# Patient Record
Sex: Female | Born: 1995 | Race: Black or African American | Hispanic: No | Marital: Single | Smoking: Never smoker
Health system: Southern US, Community
[De-identification: ages and names within clinical notes are randomized; demographics above are authoritative.]

## PROBLEM LIST (undated history)

## (undated) DIAGNOSIS — D649 Anemia, unspecified: Secondary | ICD-10-CM

## (undated) DIAGNOSIS — F419 Anxiety disorder, unspecified: Secondary | ICD-10-CM

---
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
## 2014-08-07 HISTORY — PX: DENTAL SURGERY: SHX609

## 2015-02-03 ENCOUNTER — Emergency Department (HOSPITAL_COMMUNITY)
Admission: EM | Admit: 2015-02-03 | Discharge: 2015-02-03 | Disposition: A | Discharge status: Home / Self Care | Payer: Medicaid Other

## 2015-02-03 ENCOUNTER — Emergency Department (HOSPITAL_COMMUNITY)
Admission: EM | Admit: 2015-02-03 | Discharge: 2015-02-03 | Disposition: A | Discharge status: Home / Self Care | Payer: Medicaid Other | Attending: Emergency Medicine | Admitting: Emergency Medicine

## 2015-02-03 ENCOUNTER — Encounter (HOSPITAL_COMMUNITY): Payer: Self-pay | Admitting: Family Medicine

## 2015-02-03 ENCOUNTER — Emergency Department (HOSPITAL_COMMUNITY): Payer: Medicaid Other

## 2015-02-03 DIAGNOSIS — Z3490 Encounter for supervision of normal pregnancy, unspecified, unspecified trimester: Secondary | ICD-10-CM

## 2015-02-03 DIAGNOSIS — O9989 Other specified diseases and conditions complicating pregnancy, childbirth and the puerperium: Secondary | ICD-10-CM | POA: Diagnosis not present

## 2015-02-03 DIAGNOSIS — O210 Mild hyperemesis gravidarum: Secondary | ICD-10-CM | POA: Diagnosis present

## 2015-02-03 DIAGNOSIS — O26899 Other specified pregnancy related conditions, unspecified trimester: Secondary | ICD-10-CM

## 2015-02-03 DIAGNOSIS — K219 Gastro-esophageal reflux disease without esophagitis: Secondary | ICD-10-CM

## 2015-02-03 DIAGNOSIS — K5900 Constipation, unspecified: Secondary | ICD-10-CM | POA: Diagnosis not present

## 2015-02-03 DIAGNOSIS — O99611 Diseases of the digestive system complicating pregnancy, first trimester: Secondary | ICD-10-CM | POA: Insufficient documentation

## 2015-02-03 DIAGNOSIS — O219 Vomiting of pregnancy, unspecified: Secondary | ICD-10-CM

## 2015-02-03 DIAGNOSIS — R067 Sneezing: Secondary | ICD-10-CM | POA: Insufficient documentation

## 2015-02-03 DIAGNOSIS — Z79899 Other long term (current) drug therapy: Secondary | ICD-10-CM | POA: Diagnosis not present

## 2015-02-03 DIAGNOSIS — R109 Unspecified abdominal pain: Secondary | ICD-10-CM

## 2015-02-03 DIAGNOSIS — R05 Cough: Secondary | ICD-10-CM | POA: Insufficient documentation

## 2015-02-03 DIAGNOSIS — Z3A08 8 weeks gestation of pregnancy: Secondary | ICD-10-CM | POA: Diagnosis not present

## 2015-02-03 DIAGNOSIS — R630 Anorexia: Secondary | ICD-10-CM | POA: Insufficient documentation

## 2015-02-03 DIAGNOSIS — R102 Pelvic and perineal pain: Secondary | ICD-10-CM

## 2015-02-03 LAB — CBC WITH DIFFERENTIAL/PLATELET
BASOS ABS: 0 10*3/uL (ref 0.0–0.1)
Basophils Relative: 0 % (ref 0–1)
Eosinophils Absolute: 0 10*3/uL (ref 0.0–0.7)
Eosinophils Relative: 1 % (ref 0–5)
HEMATOCRIT: 36.7 % (ref 36.0–46.0)
Hemoglobin: 12.6 g/dL (ref 12.0–15.0)
LYMPHS PCT: 35 % (ref 12–46)
Lymphs Abs: 2 10*3/uL (ref 0.7–4.0)
MCH: 28.1 pg (ref 26.0–34.0)
MCHC: 34.3 g/dL (ref 30.0–36.0)
MCV: 81.7 fL (ref 78.0–100.0)
MONO ABS: 0.5 10*3/uL (ref 0.1–1.0)
Monocytes Relative: 9 % (ref 3–12)
NEUTROS ABS: 3.2 10*3/uL (ref 1.7–7.7)
NEUTROS PCT: 55 % (ref 43–77)
Platelets: 319 10*3/uL (ref 150–400)
RBC: 4.49 MIL/uL (ref 3.87–5.11)
RDW: 13.2 % (ref 11.5–15.5)
WBC: 5.8 10*3/uL (ref 4.0–10.5)

## 2015-02-03 LAB — COMPREHENSIVE METABOLIC PANEL
ALT: 17 U/L (ref 14–54)
ANION GAP: 11 (ref 5–15)
AST: 22 U/L (ref 15–41)
Albumin: 4.1 g/dL (ref 3.5–5.0)
Alkaline Phosphatase: 22 U/L — ABNORMAL LOW (ref 38–126)
BILIRUBIN TOTAL: 0.7 mg/dL (ref 0.3–1.2)
CALCIUM: 9.4 mg/dL (ref 8.9–10.3)
CHLORIDE: 102 mmol/L (ref 101–111)
CO2: 21 mmol/L — AB (ref 22–32)
CREATININE: 0.59 mg/dL (ref 0.44–1.00)
GLUCOSE: 103 mg/dL — AB (ref 65–99)
Potassium: 3.9 mmol/L (ref 3.5–5.1)
Sodium: 134 mmol/L — ABNORMAL LOW (ref 135–145)
Total Protein: 7.2 g/dL (ref 6.5–8.1)

## 2015-02-03 LAB — I-STAT BETA HCG BLOOD, ED (MC, WL, AP ONLY): I-stat hCG, quantitative: >2000 m[IU]/mL — ABNORMAL HIGH (ref <5)

## 2015-02-03 LAB — HCG, QUANTITATIVE, PREGNANCY: hCG, Beta Chain, Quant, S: 195159 m[IU]/mL — ABNORMAL HIGH (ref <5)

## 2015-02-03 MED ORDER — FAMOTIDINE 20 MG PO TABS
20.0000 mg | ORAL_TABLET | Freq: Once | ORAL | Status: AC
  Administered 2015-02-03: 20 mg via ORAL
  Filled 2015-02-03: qty 1

## 2015-02-03 MED ORDER — FAMOTIDINE 20 MG PO TABS: 20.0000 mg | ORAL_TABLET | Freq: Two times a day (BID) | ORAL | Status: DC

## 2015-02-03 NOTE — Discharge Instructions (Signed)
Morning Sickness Morning sickness is when you feel sick to your stomach (nauseous) during pregnancy. This nauseous feeling may or may not come with vomiting. It often occurs in the morning but can be a problem any time of day. Morning sickness is most common during the first trimester, but it may continue throughout pregnancy. While morning sickness is unpleasant, it is usually harmless unless you develop severe and continual vomiting (hyperemesis gravidarum). This condition requires more intense treatment.  CAUSES  The cause of morning sickness is not completely known but seems to be related to normal hormonal changes that occur in pregnancy. RISK FACTORS You are at greater risk if you:  Experienced nausea or vomiting before your pregnancy.  Had morning sickness during a previous pregnancy.  Are pregnant with more than one baby, such as twins. TREATMENT  Do not use any medicines (prescription, over-the-counter, or herbal) for morning sickness without first talking to your health care provider. Your health care provider may prescribe or recommend:  Vitamin B6 supplements.  Anti-nausea medicines.  The herbal medicine ginger. HOME CARE INSTRUCTIONS   Only take over-the-counter or prescription medicines as directed by your health care provider.  Taking multivitamins before getting pregnant can prevent or decrease the severity of morning sickness in most women.  Eat a piece of dry toast or unsalted crackers before getting out of bed in the morning.  Eat five or six small meals a day.  Eat dry and bland foods (rice, baked potato). Foods high in carbohydrates are often helpful.  Do not drink liquids with your meals. Drink liquids between meals.  Avoid greasy, fatty, and spicy foods.  Get someone to cook for you if the smell of any food causes nausea and vomiting.  If you feel nauseous after taking prenatal vitamins, take the vitamins at night or with a snack.  Snack on protein  foods (nuts, yogurt, cheese) between meals if you are hungry.  Eat unsweetened gelatins for desserts.  Wearing an acupressure wristband (worn for sea sickness) may be helpful.  Acupuncture may be helpful.  Do not smoke.  Get a humidifier to keep the air in your house free of odors.  Get plenty of fresh air. SEEK MEDICAL CARE IF:   Your home remedies are not working, and you need medicine.  You feel dizzy or lightheaded.  You are losing weight. SEEK IMMEDIATE MEDICAL CARE IF:   You have persistent and uncontrolled nausea and vomiting.  You pass out (faint). MAKE SURE YOU:  Understand these instructions.  Will watch your condition.  Will get help right away if you are not doing well or get worse. Document Released: 09/14/2006 Document Revised: 07/29/2013 Document Reviewed: 01/08/2013 [facility] Patient Information 2015 [place], [place]. This information is not intended to replace advice given to you by your health care provider. Make sure you discuss any questions you have with your health care provider.    Abdominal Pain During Pregnancy Abdominal pain is common in pregnancy. Most of the time, it does not cause harm. There are many causes of abdominal pain. Some causes are more serious than others. Some of the causes of abdominal pain in pregnancy are easily diagnosed. Occasionally, the diagnosis takes time to understand. Other times, the cause is not determined. Abdominal pain can be a sign that something is very wrong with the pregnancy, or the pain may have nothing to do with the pregnancy at all. For this reason, always tell your health care provider if you have any abdominal discomfort. HOME  CARE INSTRUCTIONS  Monitor your abdominal pain for any changes. The following actions may help to alleviate any discomfort you are experiencing:  Do not have sexual intercourse or put anything in your vagina until your symptoms go away completely.  Get plenty of rest until your pain  improves.  Drink clear fluids if you feel nauseous. Avoid solid food as long as you are uncomfortable or nauseous.  Only take over-the-counter or prescription medicine as directed by your health care provider.  Keep all follow-up appointments with your health care provider. SEEK IMMEDIATE MEDICAL CARE IF:  You are bleeding, leaking fluid, or passing tissue from the vagina.  You have increasing pain or cramping.  You have persistent vomiting.  You have painful or bloody urination.  You have a fever.  You notice a decrease in your baby's movements.  You have extreme weakness or feel faint.  You have shortness of breath, with or without abdominal pain.  You develop a severe headache with abdominal pain.  You have abnormal vaginal discharge with abdominal pain.  You have persistent diarrhea.  You have abdominal pain that continues even after rest, or gets worse. MAKE SURE YOU:   Understand these instructions.  Will watch your condition.  Will get help right away if you are not doing well or get worse. Document Released: 07/24/2005 Document Revised: 05/14/2013 Document Reviewed: 02/20/2013 [facility] Patient Information 2015 [place], [place]. This information is not intended to replace advice given to you by your health care provider. Make sure you discuss any questions you have with your health care provider.  Food Choices for Gastroesophageal Reflux Disease When you have gastroesophageal reflux disease (GERD), the foods you eat and your eating habits are very important. Choosing the right foods can help ease the discomfort of GERD. WHAT GENERAL GUIDELINES DO I NEED TO FOLLOW?  Choose fruits, vegetables, whole grains, low-fat dairy products, and low-fat meat, fish, and poultry.  Limit fats such as oils, salad dressings, butter, nuts, and avocado.  Keep a food diary to identify foods that cause symptoms.  Avoid foods that cause reflux. These may be different for different  people.  Eat frequent small meals instead of three large meals each day.  Eat your meals slowly, in a relaxed setting.  Limit fried foods.  Cook foods using methods other than frying.  Avoid drinking alcohol.  Avoid drinking large amounts of liquids with your meals.  Avoid bending over or lying down until 2-3 hours after eating. WHAT FOODS ARE NOT RECOMMENDED? The following are some foods and drinks that may worsen your symptoms: Vegetables Tomatoes. Tomato juice. Tomato and spaghetti sauce. Chili peppers. Onion and garlic. Horseradish. Fruits Oranges, grapefruit, and lemon (fruit and juice). Meats High-fat meats, fish, and poultry. This includes hot dogs, ribs, ham, sausage, salami, and bacon. Dairy Whole milk and chocolate milk. Sour cream. Cream. Butter. Ice cream. Cream cheese.  Beverages Coffee and tea, with or without caffeine. Carbonated beverages or energy drinks. Condiments Hot sauce. Barbecue sauce.  Sweets/Desserts Chocolate and cocoa. Donuts. Peppermint and spearmint. Fats and Oils High-fat foods, including [place] fries and potato chips. Other Vinegar. Strong spices, such as black pepper, white pepper, red pepper, cayenne, curry powder, cloves, ginger, and chili powder. The items listed above may not be a complete list of foods and beverages to avoid. Contact your dietitian for more information. Document Released: 07/24/2005 Document Revised: 07/29/2013 Document Reviewed: 05/28/2013 [facility] Patient Information 2015 [place], [place]. This information is not intended to replace advice given to  you by your health care provider. Make sure you discuss any questions you have with your health care provider.

## 2015-02-03 NOTE — ED Notes (Signed)
Pt at ultrasound

## 2015-02-03 NOTE — ED Notes (Signed)
Pt not in lobby-- no answer outside also.

## 2015-02-03 NOTE — ED Provider Notes (Signed)
CSN: [id number]     Arrival date & time 02/03/15  1640 History  This chart was scribed for Leah Sours, PA-C, working with [name], MD by Chestine Spore, ED Scribe. The patient was seen in room TR11C/TR11C at 5:57 PM.    Chief Complaint  Patient presents with  . Emesis  . Emesis During Pregnancy      The history is provided by the patient. No language interpreter was used.    HPI Comments: Leah Holt is a 19 y.o. female who presents to the Emergency Department complaining of intermittent vomiting onset 2 weeks. Pt notes that she is [redacted] weeks pregnant and that she has an appointment with her OB-GYN 02/11/15 to confirm a pregnancy. Pt has not had an ultrasound yet and she has had lower bilateral abdominal pain for the past couple weeks, but none currently. She reports that when she first found out she was pregnant she was vomiting 7-8 times a day. Pt took a home pregnancy test to determine the pregnancy. Pt notes that her vomiting is during the whole day and not time specific. Pt voices that her vomiting and nausea resolved after the pt ate fruit PTA to the ED. She notes that she tries to eat full meals, but she gets full after a couple bites. Pt was pregnant last year and had a missed miscarriage at 7 weeks.  She states that she is having associated symptoms of constipation, cough x 1 week, sneezing, and appetite change. She denies nausea, abdominal pain, blood in vomit, diarrhea, vag bleeding, vaginal discharge, dysuria, hematuria, blood in stool, frequency, urgency, CP, SOB, itchy watery eyes, rhinorrhea, ear pain, rash, neck pain, HA, neck stiffness, visual disturbance, lightheaded, dizziness, nausea, vomiting, and any other symptoms. Denies abdominal surgeries.    History reviewed. No pertinent past medical history. History reviewed. No pertinent past surgical history. History reviewed. No pertinent family history. History  Substance Use Topics  . Smoking status: Never Smoker    . Smokeless tobacco: Not on file  . Alcohol Use: No   OB History    Gravida Para Term Preterm AB TAB SAB Ectopic Multiple Living   1              Review of Systems  Constitutional: Positive for appetite change.  HENT: Positive for sneezing. Negative for congestion, ear pain and rhinorrhea.   Eyes: Negative for itching and visual disturbance.  Respiratory: Positive for cough. Negative for shortness of breath.   Cardiovascular: Negative for chest pain.  Gastrointestinal: Positive for vomiting and constipation. Negative for nausea, abdominal pain, diarrhea and blood in stool.  Genitourinary: Negative for vaginal bleeding and vaginal discharge.  Musculoskeletal: Negative for neck pain and neck stiffness.  Skin: Negative for rash.  Neurological: Negative for dizziness, syncope, weakness, light-headedness and headaches.      Allergies  Review of patient's allergies indicates no known allergies.  Home Medications   Prior to Admission medications   Medication Sig Start Date End Date Taking? Authorizing Provider  desogestrel-ethinyl estradiol (KARIVA,AZURETTE,MIRCETTE) 0.15-0.02/0.01 MG (21/5) tablet Take 1 tablet by mouth daily.   Yes Historical Provider, MD  famotidine (PEPCID) 20 MG tablet Take 1 tablet (20 mg total) by mouth 2 (two) times daily. 02/03/15   Everlene Farrier, PA-C   BP 122/73 mmHg  Pulse 57  Temp(Src) 98.2 F (36.8 C) (Oral)  Resp 16  SpO2 99%  LMP 12/07/2014 Physical Exam  Constitutional: She is oriented to person, place, and time. She appears well-developed and well-nourished.  No distress.  Nontoxic appearing.  HENT:  Head: Normocephalic and atraumatic.  Right Ear: External ear normal.  Left Ear: External ear normal.  Mouth/Throat: Oropharynx is clear and moist and mucous membranes are normal. No oropharyngeal exudate.  Eyes: Conjunctivae are normal. Pupils are equal, round, and reactive to light. Right eye exhibits no discharge. Left eye exhibits no  discharge.  Neck: Normal range of motion. Neck supple. No JVD present. No tracheal deviation present.  Cardiovascular: Normal rate, regular rhythm, normal heart sounds and intact distal pulses.   Pulmonary/Chest: Effort normal and breath sounds normal. No respiratory distress.  Abdominal: Soft. Bowel sounds are normal. She exhibits no distension and no mass. There is no tenderness. There is no rebound, no guarding and no CVA tenderness.  Negative psoas and obturator sign. Abdomen soft and non-tender to palpation. No CVA tenderness, no masses. Bowel sounds normal  Musculoskeletal: She exhibits no edema.  Lymphadenopathy:    She has no cervical adenopathy.  Neurological: She is alert and oriented to person, place, and time. Coordination normal.  Skin: Skin is warm and dry. No rash noted. She is not diaphoretic. No erythema. No pallor.  Psychiatric: She has a normal mood and affect. Her behavior is normal.  Nursing note and vitals reviewed.   ED Course  Procedures (including critical care time) DIAGNOSTIC STUDIES: Oxygen Saturation is 100% on RA, nl by my interpretation.    COORDINATION OF CARE: 6:05 PM-Discussed treatment plan which includes I stat hCG, CBC, ultrasound, f/u with OB-GYN as scheduled with pt at bedside and pt agreed to plan.   Labs Review Labs Reviewed  COMPREHENSIVE METABOLIC PANEL - Abnormal; Notable for the following:    Sodium 134 (*)    CO2 21 (*)    Glucose, Bld 103 (*)    BUN <5 (*)    Alkaline Phosphatase 22 (*)    All other components within normal limits  HCG, QUANTITATIVE, PREGNANCY - Abnormal; Notable for the following:    hCG, Beta Chain, Quant, [place] [id number] (*)    All other components within normal limits  I-STAT BETA HCG BLOOD, ED ([facility], [facility], [facility] ONLY) - Abnormal; Notable for the following:    I-stat hCG, quantitative >2000.0 (*)    All other components within normal limits  CBC WITH DIFFERENTIAL/PLATELET    Imaging Review [place] Ob Comp Less 14  Wks  02/03/2015   CLINICAL DATA:  19 year old pregnant female with pelvic pain.  EXAM: OBSTETRIC <14 WK US AND TRANSVAGINAL OB  US DOPPLER ULTRASOUND OF OVARIES  TECHNIQUE: Both transabdominal and transvaginal ultrasound examinations were performed for complete evaluation of the gestation as well as the maternal uterus, adnexal regions, and pelvic cul-de-sac. Transvaginal technique was performed to assess early pregnancy.  Color and duplex Doppler ultrasound was utilized to evaluate blood flow to the ovaries.  COMPARISON:  None.  FINDINGS: Intrauterine gestational sac: Visualized/normal in shape.  Yolk sac:  Seen  Embryo:  Present  Cardiac Activity: Present  Heart Rate: 153 bpm  CRL:   13  mm   7 w 4 d                  US EDC: 09/18/2015  Maternal uterus/adnexae: The uterus measures 10.8 x 5.4 x 6.9 cm appears grossly unremarkable. The right ovary measures 3.2 x 1.4 x 2.0 cm and the left ovary measures 3.0 x 1.9 x 2.7 cm.  Doppler images demonstrate flow to both ovaries.  IMPRESSION: Single live intrauterine pregnancy with estimated gestational  age of [redacted] weeks, 4 days.   Electronically Signed   By: Elgie Collard M.D.   On: 02/03/2015 19:19   US Ob Transvaginal  02/03/2015   CLINICAL DATA:  19 year old pregnant female with pelvic pain.  EXAM: OBSTETRIC <14 WK [place] AND TRANSVAGINAL OB  US DOPPLER ULTRASOUND OF OVARIES  TECHNIQUE: Both transabdominal and transvaginal ultrasound examinations were performed for complete evaluation of the gestation as well as the maternal uterus, adnexal regions, and pelvic cul-de-sac. Transvaginal technique was performed to assess early pregnancy.  Color and duplex Doppler ultrasound was utilized to evaluate blood flow to the ovaries.  COMPARISON:  None.  FINDINGS: Intrauterine gestational sac: Visualized/normal in shape.  Yolk sac:  Seen  Embryo:  Present  Cardiac Activity: Present  Heart Rate: 153 bpm  CRL:   13  mm   7 w 4 d                  [place] EDC: 09/18/2015  Maternal  uterus/adnexae: The uterus measures 10.8 x 5.4 x 6.9 cm appears grossly unremarkable. The right ovary measures 3.2 x 1.4 x 2.0 cm and the left ovary measures 3.0 x 1.9 x 2.7 cm.  Doppler images demonstrate flow to both ovaries.  IMPRESSION: Single live intrauterine pregnancy with estimated gestational age of [redacted] weeks, 4 days.   Electronically Signed   By: Elgie Collard M.D.   On: 02/03/2015 19:19     EKG Interpretation None      Filed Vitals:   02/03/15 1701 02/03/15 1925  BP: 131/66 122/73  Pulse: 98 57  Temp: 98.2 F (36.8 C)   TempSrc: Oral   Resp: 18 16  SpO2: 100% 99%     MDM   Meds given in ED:  Medications  famotidine (PEPCID) tablet 20 mg (20 mg Oral Given 02/03/15 2000)    New Prescriptions   FAMOTIDINE (PEPCID) 20 MG TABLET    Take 1 tablet (20 mg total) by mouth 2 (two) times daily.    Final diagnoses:  Pregnancy  Nausea/vomiting in pregnancy  Abdominal pain in pregnancy  Gastroesophageal reflux disease, esophagitis presence not specified   This is a 19 year old female who is G2 P0010 presents to the emergency department complaining of intermittent nausea, vomiting and abdominal pain ongoing for the past 2 weeks. Patient reports she was having bilateral lower abdominal pain prior to arrival to the emergency department surgery with nausea but this resolved after she ate fruit prior to arrival to the emergency department. Upon my evaluation the patient denies any abdominal pain or nausea. Her abdomen is soft and nontender to palpation. She is afebrile and nontoxic appearing. Her quantitative pregnancy is [id number]. CBC is within normal limits. The patient's CMP shows a sodium of 134 and is otherwise unremarkable. Despite the patient not having current abdominal pain, she has had intermittent abdominal pain recently and does not have a confirmed intrauterine pregnancy, will do an ultrasound to confirm IUP.  Ultrasound revealed a single live intrauterine pregnancy with an  estimated gestational age of [redacted] weeks and 4 days. It demonstrated good flow to both ovaries. I reevaluation the patient still denies abdominal pain. Patient has tolerated Pepcid PO in the ED with water prior to discharge. Patient also does reveal having lots of acid reflux symptoms. I discussed with the patient 2 choices that could help with her nausea and acid reflux. I will also start the patient on Pepcid twice a day and advised her to keep her follow-up appointment  with her OB/GYN as already planned. Strict return precautions provided. I advised the patient to follow-up with their primary care provider this week. I advised the patient to return to the emergency department with new or worsening symptoms or new concerns. The patient verbalized understanding and agreement with plan.    I personally performed the services described in this documentation, which was scribed in my presence. The recorded information has been reviewed and is accurate.    Everlene Farrier, PA-C 02/03/15 2017  [name], MD 02/04/15 [phone]

## 2015-02-03 NOTE — ED Notes (Signed)
Have called pt multiple times in lobby with no answer 

## 2015-02-03 NOTE — ED Notes (Signed)
Pt here for intermittent vomiting over the past 2 weeks. sts she is 8 weeks. sts she is suppose to be seen on the 7th to confirm pregnancy. sts she can hold some food down.

## 2015-02-03 NOTE — ED Notes (Signed)
Pt stable, ambulatory, states understanding of discharge instructions 

## 2015-02-11 ENCOUNTER — Other Ambulatory Visit: Payer: Self-pay | Admitting: Obstetrics

## 2015-02-11 DIAGNOSIS — N6311 Unspecified lump in the right breast, upper outer quadrant: Secondary | ICD-10-CM

## 2015-02-11 LAB — OB RESULTS CONSOLE RPR: RPR: NONREACTIVE

## 2015-02-11 LAB — OB RESULTS CONSOLE ABO/RH: RH Type: POSITIVE

## 2015-02-11 LAB — OB RESULTS CONSOLE RUBELLA ANTIBODY, IGM: RUBELLA: IMMUNE

## 2015-02-11 LAB — OB RESULTS CONSOLE HEPATITIS B SURFACE ANTIGEN: Hepatitis B Surface Ag: NEGATIVE

## 2015-02-11 LAB — OB RESULTS CONSOLE GC/CHLAMYDIA
Chlamydia: NEGATIVE
Gonorrhea: NEGATIVE

## 2015-02-11 LAB — OB RESULTS CONSOLE HIV ANTIBODY (ROUTINE TESTING): HIV: NONREACTIVE

## 2015-02-11 LAB — OB RESULTS CONSOLE ANTIBODY SCREEN: ANTIBODY SCREEN: NEGATIVE

## 2015-02-15 ENCOUNTER — Other Ambulatory Visit: Payer: Medicaid Other

## 2015-02-16 ENCOUNTER — Ambulatory Visit
Admission: RE | Admit: 2015-02-16 | Discharge: 2015-02-16 | Disposition: A | Discharge status: Home / Self Care | Payer: Medicaid Other | Source: Ambulatory Visit | Attending: Obstetrics | Admitting: Obstetrics

## 2015-02-16 DIAGNOSIS — N6311 Unspecified lump in the right breast, upper outer quadrant: Secondary | ICD-10-CM

## 2015-07-14 LAB — OB RESULTS CONSOLE GC/CHLAMYDIA
Chlamydia: NEGATIVE
Gonorrhea: NEGATIVE

## 2015-07-14 LAB — OB RESULTS CONSOLE GBS: GBS: NEGATIVE

## 2015-08-08 NOTE — L&D Delivery Note (Signed)
Delivery Note At 5:28 PM a viable female was delivered via Vaginal, Spontaneous Delivery (Presentation: Left Occiput Anterior).  APGAR: , ; weight  .   Placenta status: Intact, Spontaneous.  Cord: 3 vessels with the following complications: None.  Cord pH: not done  Anesthesia: Epidural  Episiotomy: None Lacerations:   Suture Repair: 2.0 Est. Blood Loss (mL):    Mom to postpartum.  Baby to Couplet care / Skin to Skin.  MARSHALL,BERNARD A 09/17/2015, 5:37 PM

## 2015-09-11 ENCOUNTER — Inpatient Hospital Stay (HOSPITAL_COMMUNITY): Admission: AD | Admit: 2015-09-11 | Payer: PRIVATE HEALTH INSURANCE | Source: Ambulatory Visit | Admitting: Obstetrics

## 2015-09-14 ENCOUNTER — Other Ambulatory Visit: Payer: Self-pay | Admitting: Obstetrics

## 2015-09-15 ENCOUNTER — Encounter (HOSPITAL_COMMUNITY): Payer: Self-pay | Admitting: *Deleted

## 2015-09-15 ENCOUNTER — Telehealth (HOSPITAL_COMMUNITY): Payer: Self-pay | Admitting: *Deleted

## 2015-09-15 NOTE — Telephone Encounter (Signed)
Preadmission screen  

## 2015-09-17 ENCOUNTER — Inpatient Hospital Stay (HOSPITAL_COMMUNITY)
Admission: RE | Admit: 2015-09-17 | Discharge: 2015-09-19 | DRG: 775 | Disposition: A | Discharge status: Home / Self Care | Payer: Medicaid Other | Source: Ambulatory Visit | Attending: Obstetrics | Admitting: Obstetrics

## 2015-09-17 ENCOUNTER — Inpatient Hospital Stay (HOSPITAL_COMMUNITY): Payer: Medicaid Other | Admitting: Anesthesiology

## 2015-09-17 ENCOUNTER — Encounter (HOSPITAL_COMMUNITY): Payer: Self-pay

## 2015-09-17 DIAGNOSIS — Z3A40 40 weeks gestation of pregnancy: Secondary | ICD-10-CM | POA: Diagnosis not present

## 2015-09-17 HISTORY — DX: Anemia, unspecified: D64.9

## 2015-09-17 HISTORY — DX: Anxiety disorder, unspecified: F41.9

## 2015-09-17 LAB — TYPE AND SCREEN
ABO/RH(D): O POS
ANTIBODY SCREEN: NEGATIVE

## 2015-09-17 LAB — CBC
HEMATOCRIT: 34.6 % — AB (ref 36.0–46.0)
HEMOGLOBIN: 11.7 g/dL — AB (ref 12.0–15.0)
MCH: 28.5 pg (ref 26.0–34.0)
MCHC: 33.8 g/dL (ref 30.0–36.0)
MCV: 84.2 fL (ref 78.0–100.0)
Platelets: 234 10*3/uL (ref 150–400)
RBC: 4.11 MIL/uL (ref 3.87–5.11)
RDW: 13.2 % (ref 11.5–15.5)
WBC: 6.8 10*3/uL (ref 4.0–10.5)

## 2015-09-17 LAB — RPR: RPR Ser Ql: NONREACTIVE

## 2015-09-17 LAB — ABO/RH: ABO/RH(D): O POS

## 2015-09-17 MED ORDER — BENZOCAINE-MENTHOL 20-0.5 % EX AERO
1.0000 "application " | INHALATION_SPRAY | CUTANEOUS | Status: DC | PRN
  Administered 2015-09-17: 1 via TOPICAL
  Filled 2015-09-17: qty 56

## 2015-09-17 MED ORDER — LIDOCAINE HCL (PF) 1 % IJ SOLN
30.0000 mL | INTRAMUSCULAR | Status: DC | PRN
  Filled 2015-09-17: qty 30

## 2015-09-17 MED ORDER — WITCH HAZEL-GLYCERIN EX PADS: 1.0000 "application " | MEDICATED_PAD | CUTANEOUS | Status: DC | PRN

## 2015-09-17 MED ORDER — CITRIC ACID-SODIUM CITRATE 334-500 MG/5ML PO SOLN: 30.0000 mL | ORAL | Status: DC | PRN

## 2015-09-17 MED ORDER — LANOLIN HYDROUS EX OINT: TOPICAL_OINTMENT | CUTANEOUS | Status: DC | PRN

## 2015-09-17 MED ORDER — DIPHENHYDRAMINE HCL 25 MG PO CAPS: 25.0000 mg | ORAL_CAPSULE | Freq: Four times a day (QID) | ORAL | Status: DC | PRN

## 2015-09-17 MED ORDER — OXYTOCIN 10 UNIT/ML IJ SOLN
1.0000 m[IU]/min | INTRAMUSCULAR | Status: DC
  Administered 2015-09-17: 2 m[IU]/min via INTRAVENOUS
  Filled 2015-09-17: qty 4

## 2015-09-17 MED ORDER — PHENYLEPHRINE 40 MCG/ML (10ML) SYRINGE FOR IV PUSH (FOR BLOOD PRESSURE SUPPORT)
PREFILLED_SYRINGE | INTRAVENOUS | Status: AC
  Filled 2015-09-17: qty 20

## 2015-09-17 MED ORDER — LIDOCAINE HCL (PF) 1 % IJ SOLN
INTRAMUSCULAR | Status: DC | PRN
  Administered 2015-09-17 (×2): 4 mL via EPIDURAL

## 2015-09-17 MED ORDER — FENTANYL 2.5 MCG/ML BUPIVACAINE 1/10 % EPIDURAL INFUSION (WH - ANES)
INTRAMUSCULAR | Status: AC
  Administered 2015-09-17: 14 mL/h via EPIDURAL
  Filled 2015-09-17: qty 125

## 2015-09-17 MED ORDER — SIMETHICONE 80 MG PO CHEW: 80.0000 mg | CHEWABLE_TABLET | ORAL | Status: DC | PRN

## 2015-09-17 MED ORDER — EPHEDRINE 5 MG/ML INJ
10.0000 mg | INTRAVENOUS | Status: DC | PRN
  Filled 2015-09-17: qty 2

## 2015-09-17 MED ORDER — PRENATAL MULTIVITAMIN CH
1.0000 | ORAL_TABLET | Freq: Every day | ORAL | Status: DC
  Administered 2015-09-18 – 2015-09-19 (×2): 1 via ORAL
  Filled 2015-09-17 (×2): qty 1

## 2015-09-17 MED ORDER — LACTATED RINGERS IV SOLN
2.5000 [IU]/h | INTRAVENOUS | Status: DC
  Administered 2015-09-17: 18:00:00 via INTRAVENOUS

## 2015-09-17 MED ORDER — ONDANSETRON HCL 4 MG PO TABS: 4.0000 mg | ORAL_TABLET | ORAL | Status: DC | PRN

## 2015-09-17 MED ORDER — BUTORPHANOL TARTRATE 1 MG/ML IJ SOLN
1.0000 mg | INTRAMUSCULAR | Status: DC | PRN
  Administered 2015-09-17 (×2): 1 mg via INTRAVENOUS
  Filled 2015-09-17 (×2): qty 1

## 2015-09-17 MED ORDER — TERBUTALINE SULFATE 1 MG/ML IJ SOLN
0.2500 mg | Freq: Once | INTRAMUSCULAR | Status: DC | PRN
  Filled 2015-09-17: qty 1

## 2015-09-17 MED ORDER — OXYTOCIN BOLUS FROM INFUSION: 500.0000 mL | INTRAVENOUS | Status: DC

## 2015-09-17 MED ORDER — ONDANSETRON HCL 4 MG/2ML IJ SOLN: 4.0000 mg | Freq: Four times a day (QID) | INTRAMUSCULAR | Status: DC | PRN

## 2015-09-17 MED ORDER — IBUPROFEN 600 MG PO TABS
600.0000 mg | ORAL_TABLET | Freq: Four times a day (QID) | ORAL | Status: DC
  Administered 2015-09-17 – 2015-09-19 (×7): 600 mg via ORAL
  Filled 2015-09-17 (×7): qty 1

## 2015-09-17 MED ORDER — FERROUS SULFATE 325 (65 FE) MG PO TABS
325.0000 mg | ORAL_TABLET | Freq: Two times a day (BID) | ORAL | Status: DC
  Administered 2015-09-18 – 2015-09-19 (×3): 325 mg via ORAL
  Filled 2015-09-17 (×3): qty 1

## 2015-09-17 MED ORDER — ACETAMINOPHEN 325 MG PO TABS: 650.0000 mg | ORAL_TABLET | ORAL | Status: DC | PRN

## 2015-09-17 MED ORDER — DIBUCAINE 1 % RE OINT: 1.0000 "application " | TOPICAL_OINTMENT | RECTAL | Status: DC | PRN

## 2015-09-17 MED ORDER — SENNOSIDES-DOCUSATE SODIUM 8.6-50 MG PO TABS
2.0000 | ORAL_TABLET | ORAL | Status: DC
  Administered 2015-09-17: 2 via ORAL
  Filled 2015-09-17 (×2): qty 2

## 2015-09-17 MED ORDER — TETANUS-DIPHTH-ACELL PERTUSSIS 5-2.5-18.5 LF-MCG/0.5 IM SUSP: 0.5000 mL | Freq: Once | INTRAMUSCULAR | Status: DC

## 2015-09-17 MED ORDER — ZOLPIDEM TARTRATE 5 MG PO TABS: 5.0000 mg | ORAL_TABLET | Freq: Every evening | ORAL | Status: DC | PRN

## 2015-09-17 MED ORDER — ONDANSETRON HCL 4 MG/2ML IJ SOLN: 4.0000 mg | INTRAMUSCULAR | Status: DC | PRN

## 2015-09-17 MED ORDER — DIPHENHYDRAMINE HCL 50 MG/ML IJ SOLN: 12.5000 mg | INTRAMUSCULAR | Status: DC | PRN

## 2015-09-17 MED ORDER — FENTANYL 2.5 MCG/ML BUPIVACAINE 1/10 % EPIDURAL INFUSION (WH - ANES)
14.0000 mL/h | INTRAMUSCULAR | Status: DC | PRN
  Administered 2015-09-17 (×2): 14 mL/h via EPIDURAL

## 2015-09-17 MED ORDER — PHENYLEPHRINE 40 MCG/ML (10ML) SYRINGE FOR IV PUSH (FOR BLOOD PRESSURE SUPPORT)
80.0000 ug | PREFILLED_SYRINGE | INTRAVENOUS | Status: DC | PRN
  Filled 2015-09-17: qty 2

## 2015-09-17 MED ORDER — LACTATED RINGERS IV SOLN
INTRAVENOUS | Status: DC
  Administered 2015-09-17 (×2): via INTRAVENOUS

## 2015-09-17 MED ORDER — FLEET ENEMA 7-19 GM/118ML RE ENEM: 1.0000 | ENEMA | RECTAL | Status: DC | PRN

## 2015-09-17 MED ORDER — FENTANYL CITRATE (PF) 100 MCG/2ML IJ SOLN: 50.0000 ug | INTRAMUSCULAR | Status: DC | PRN

## 2015-09-17 MED ORDER — LACTATED RINGERS IV SOLN
500.0000 mL | INTRAVENOUS | Status: DC | PRN
  Administered 2015-09-17: 500 mL via INTRAVENOUS

## 2015-09-17 NOTE — Lactation Note (Signed)
This note was copied from a baby's chart. Lactation Consultation Note Initial visit at 5 hours of age.  Mom is holding baby STS and baby is asleep.  Mom reports baby is fussy if she is not holding her.  Mom reports a few feedings and is learning to latch baby.  Mom is aware of painful latch, how to [place] baby and relatch if needed.  [facility] LC resources given and discussed.  Encouraged to feed with early cues on demand.  Early newborn behavior discussed.  Offered to assist with hand expression, but mom declines at this time.  Encouraged mom to request RN to instruct her on hand expression.   Mom to call for assist as needed.    Patient Name: Leah Holt Today's Date: 09/17/2015 Reason for consult: Initial assessment   Maternal Data Has patient been taught Hand Expression?: Yes Does the patient have breastfeeding experience prior to this delivery?: No  Feeding Feeding Type: Breast Fed Length of feed: 25 min  LATCH Score/Interventions Latch: Repeated attempts needed to sustain latch, nipple held in mouth throughout feeding, stimulation needed to elicit sucking reflex. Intervention(s): Teach feeding cues;Skin to skin Intervention(s): Assist with latch;Adjust position  Audible Swallowing: None  Type of Nipple: Flat  Comfort (Breast/Nipple): Soft / non-tender     Hold (Positioning): Assistance needed to correctly position infant at breast and maintain latch. Intervention(s): Breastfeeding basics reviewed  LATCH Score: 5  Lactation Tools Discussed/Used     Consult Status Consult Status: Follow-up Date: 09/18/15 Follow-up type: In-patient    Jannifer Rodney 09/17/2015, 11:05 PM

## 2015-09-17 NOTE — Anesthesia Preprocedure Evaluation (Signed)
Anesthesia Evaluation  Patient identified by MRN, date of birth, ID band Patient awake    Reviewed: Allergy & Precautions, NPO status , Patient's Chart, lab work & pertinent test results  Airway Mallampati: III  TM Distance: >3 FB Neck ROM: Full    Dental no notable dental hx. (+) Teeth Intact   Pulmonary neg pulmonary ROS,    Pulmonary exam normal breath sounds clear to auscultation       Cardiovascular negative cardio ROS Normal cardiovascular exam Rhythm:Regular Rate:Normal     Neuro/Psych Anxiety    GI/Hepatic Neg liver ROS, GERD  Medicated and Controlled,  Endo/Other  negative endocrine ROS  Renal/GU negative Renal ROS  negative genitourinary   Musculoskeletal negative musculoskeletal ROS (+)   Abdominal   Peds  Hematology  (+) anemia ,   Anesthesia Other Findings   Reproductive/Obstetrics (+) Pregnancy                             Anesthesia Physical Anesthesia Plan  ASA: II  Anesthesia Plan: Epidural   Post-op Pain Management:    Induction:   Airway Management Planned: Natural Airway  Additional Equipment:   Intra-op Plan:   Post-operative Plan:   Informed Consent: I have reviewed the patients History and Physical, chart, labs and discussed the procedure including the risks, benefits and alternatives for the proposed anesthesia with the patient or authorized representative who has indicated his/her understanding and acceptance.     Plan Discussed with: Anesthesiologist  Anesthesia Plan Comments:         Anesthesia Quick Evaluation

## 2015-09-17 NOTE — Anesthesia Procedure Notes (Signed)
Epidural Patient location during procedure: OB Start time: 09/17/2015 2:50 PM  Staffing Anesthesiologist: Mal Amabile Performed by: anesthesiologist   Preanesthetic Checklist Completed: patient identified, site marked, surgical consent, pre-op evaluation, timeout performed, IV checked, risks and benefits discussed and monitors and equipment checked  Epidural Patient position: sitting Prep: site prepped and draped and DuraPrep Patient monitoring: continuous pulse ox and blood pressure Approach: midline Location: L4-L5 Injection technique: LOR air  Needle:  Needle type: Tuohy  Needle gauge: 17 G Needle length: 9 cm and 9 Needle insertion depth: 5 cm cm Catheter type: closed end flexible Catheter size: 19 Gauge Catheter at skin depth: 9 cm Test dose: negative and Other  Assessment Events: blood not aspirated, injection not painful, no injection resistance, negative IV test and no paresthesia  Additional Notes Patient identified. Risks and benefits discussed including failed block, incomplete  Pain control, post dural puncture headache, nerve damage, paralysis, blood pressure Changes, nausea, vomiting, reactions to medications-both toxic and allergic and post Partum back pain. All questions were answered. Patient expressed understanding and wished to proceed. Sterile technique was used throughout procedure. Epidural site was Dressed with sterile barrier dressing. No paresthesias, signs of intravascular injection Or signs of intrathecal spread were encountered.  Patient was more comfortable after the epidural was dosed. Please see RN's note for documentation of vital signs and FHR which are stable.

## 2015-09-17 NOTE — Consults (Signed)
  Anesthesia Pain Consult Note  Patient: Leah Holt, 19 y.o., female  Consult Requested by: Kathreen Cosier, MD  Reason for Consult: CRNA pain rounds  Level of Consciousness: alert  Pain: none, pain goal 6-7     [facility] 09/17/2015

## 2015-09-17 NOTE — H&P (Signed)
This is Dr. Francoise Ceo dictating the history and physical on  Leah Holt  she is a 20 year old gravida 2 para 0010 negative GBS EDC to 417 she's 40 weeks and 6 days brought in for induction she is on low-dose Pitocin her cervix is 1 cm 90% vertex at a 0 station amniotomy performed no fluid noted and she's having irregular contractions past medical history negative Past surgical history negative Social history denies smoking drinking or drug use Family history negative System review noncontributory Physical exam well-developed female in early labor HEENT negative Lungs clear to P&A Heart regular rhythm no murmurs no gallops Rest negative Abdomen term Extremities negative

## 2015-09-18 LAB — CBC
HEMATOCRIT: 33.2 % — AB (ref 36.0–46.0)
HEMOGLOBIN: 11.1 g/dL — AB (ref 12.0–15.0)
MCH: 28 pg (ref 26.0–34.0)
MCHC: 33.4 g/dL (ref 30.0–36.0)
MCV: 83.6 fL (ref 78.0–100.0)
Platelets: 179 10*3/uL (ref 150–400)
RBC: 3.97 MIL/uL (ref 3.87–5.11)
RDW: 13.2 % (ref 11.5–15.5)
WBC: 11.9 10*3/uL — ABNORMAL HIGH (ref 4.0–10.5)

## 2015-09-18 NOTE — Anesthesia Postprocedure Evaluation (Signed)
Anesthesia Post Note  Patient: Leah Holt  Procedure(s) Performed: * No procedures listed *  Patient location during evaluation: Mother Baby Anesthesia Type: Epidural Level of consciousness: awake and alert, oriented and patient cooperative Pain management: pain level controlled Vital Signs Assessment: post-procedure vital signs reviewed and stable Respiratory status: spontaneous breathing Cardiovascular status: stable Postop Assessment: no headache, epidural receding, patient able to bend at knees and no signs of nausea or vomiting Anesthetic complications: no Comments: Pt states pain level controlled at time of interview.    Last Vitals:  Filed Vitals:   09/18/15 0116 09/18/15 0458  BP: 95/46 117/71  Pulse: 63 54  Temp: 36.7 C 36.4 C  Resp: 18 20    Last Pain:  Filed Vitals:   09/18/15 0558  PainSc: 0-No pain                 [facility]

## 2015-09-18 NOTE — Progress Notes (Signed)
Patient ID: Leah Holt, female   DOB: Sep 05, 1995, 20 y.o.   MRN: [id number] Postpartum day one Blood pressure 117/71 afebrile pulse 54 respiration 20 fundus firm Lochia moderate Legs negative Doing well

## 2015-09-18 NOTE — Lactation Note (Signed)
This note was copied from a baby's chart. Lactation Consultation Note  Patient Name: Leah Holt Today's Date: 09/18/2015 Reason for consult: Follow-up assessment Baby at 28 hr of life and mom was having trouble latching. Tried the NS but mom reported pinching. Mom was able to manually express and get the baby latched with less pinching. Encouraged mom to keep working with baby and use manual express along with spoon feeding if needed. Reminded of cluster feedings in the 2nd day of life.   Maternal Data    Feeding Feeding Type: Breast Fed Length of feed: 10 min  LATCH Score/Interventions Latch: Repeated attempts needed to sustain latch, nipple held in mouth throughout feeding, stimulation needed to elicit sucking reflex. Intervention(s): Skin to skin;Waking techniques Intervention(s): Adjust position;Assist with latch;Breast compression  Audible Swallowing: Spontaneous and intermittent Intervention(s): Hand expression  Type of Nipple: Everted at rest and after stimulation Intervention(s): No intervention needed  Comfort (Breast/Nipple): Filling, red/small blisters or bruises, mild/mod discomfort  Problem noted: Mild/Moderate discomfort Interventions (Mild/moderate discomfort): Hand expression  Hold (Positioning): Assistance needed to correctly position infant at breast and maintain latch.  LATCH Score: 7  Lactation Tools Discussed/Used     Consult Status Consult Status: Follow-up Date: 09/19/15 Follow-up type: In-patient    Rulon Eisenmenger 09/18/2015, 9:40 PM

## 2015-09-18 NOTE — Lactation Note (Signed)
This note was copied from a baby's chart. Lactation Consultation Note  Patient Name: Leah Holt Today's Date: 09/18/2015 Reason for consult: Follow-up assessment Baby at 22 hr of life and mom reports baby is very sleepy. FOB was able to wake baby and mom was able to latch baby with a #20 NS. She was given one last night but lost it, LC gave another one. Encouraged mom to try latching baby without NS. Mom does have erect nipples with mild stimulation. Discussed baby behavior, feeding frequency, pumping,baby belly size, voids, wt loss, breast changes, and nipple care. Mom stated that she can manually express and has seen colostrum bilaterally. She is aware of OP services and support group. She will call for help as needed.    Maternal Data    Feeding Feeding Type: Breast Fed Length of feed: 10 min  LATCH Score/Interventions Latch: Grasps breast easily, tongue down, lips flanged, rhythmical sucking. Intervention(s): Skin to skin;Waking techniques;Teach feeding cues Intervention(s): Adjust position  Audible Swallowing: Spontaneous and intermittent Intervention(s): Hand expression  Type of Nipple: Everted at rest and after stimulation Intervention(s): Hand pump;Shells  Comfort (Breast/Nipple): Soft / non-tender     Hold (Positioning): Assistance needed to correctly position infant at breast and maintain latch. Intervention(s): Support Pillows;Position options  LATCH Score: 9  Lactation Tools Discussed/Used Pump Review: Setup, frequency, and cleaning;Milk Storage Initiated by:: ES Date initiated:: 09/19/15   Consult Status Consult Status: Follow-up Date: 09/19/15 Follow-up type: In-patient    Rulon Eisenmenger 09/18/2015, 4:28 PM

## 2015-09-19 NOTE — Progress Notes (Signed)
Patient ID: Leah Holt, female   DOB: 02/07/1996, 20 y.o.   MRN: [id number] Postpartum day 2 Blood pressure 106/62 pulse 61 respiration 18 afebrile Fundus firm Lochia moderate Legs negative Home today

## 2015-09-19 NOTE — Discharge Summary (Signed)
Obstetric Discharge Summary Reason for Admission: induction of labor Prenatal Procedures: none Intrapartum Procedures: spontaneous vaginal delivery Postpartum Procedures: none Complications-Operative and Postpartum: none HEMOGLOBIN  Date Value Ref Range Status  09/18/2015 11.1* 12.0 - 15.0 g/dL Final   HCT  Date Value Ref Range Status  09/18/2015 33.2* 36.0 - 46.0 % Final    Physical Exam:  General: alert Lochia: appropriate Uterine Fundus: firm Incision: healing well DVT Evaluation: No evidence of DVT seen on physical exam.  Discharge Diagnoses: Term Pregnancy-delivered  Discharge Information: Date: 09/19/2015 Activity: pelvic rest Diet: routine Medications: Percocet Condition: improved Instructions: refer to practice specific booklet Discharge to: home Follow-up Information    Follow up with Kathreen Cosier, MD.   Specialty:  Obstetrics and Gynecology   Contact information:   [address] [place] [address] [phone]       Newborn Data: Live born female  Birth Weight: 6 lb 7.7 oz (2940 g) APGAR: 8, 9  Home with mother.  MARSHALL,BERNARD A 09/19/2015, 6:03 AM

## 2015-09-19 NOTE — Discharge Instructions (Signed)
Discharge instructions   You can wash your hair  Shower  Eat what you want  Drink what you want  See me in 6 weeks  Your ankles are going to swell more in the next 2 weeks than when pregnant  No sex for 6 weeks   Tatiyana Foucher A, MD 09/19/2015

## 2015-09-19 NOTE — Lactation Note (Signed)
This note was copied from a baby's chart. Lactation Consultation Note  Patient Name: Leah Holt Today's Date: 09/19/2015 Reason for consult: Follow-up assessment  Baby 40 hours old. Mom reports that it takes her a little while to get the baby latched onto the breast, and sometimes she does need to use NS. Discussed with mom methods of moving away from NS--especially as her breast milk comes to volume. Mom aware of OP/BFSG and LC phone line assistance as well. Discussed the need for additional breast stimulation when using NS to protect breast milk supply, and the need to follow baby's weight gain carefully with NS use.   Baby just finished nursing for 25 minutes and mom reports that baby nursed rhythmically with swallows noted, then came off the breast seemingly satisfied. Enc mom to call for assistance with latching baby at next feed as needed. Demonstrated positioning with pillows and how to use a rolled wash cloth under mom's breast for support--mom has large pendulous/heavy breast. Enc mom to lay baby next to her, STS, and massage and hand express prior to latching the baby. Enc FOB to assist by holding baby's hands out of the way. Mom states that she feels that nursing is improving each time she latches the baby.   Discussed engorgement prevention/treatment and referred parents to Baby and Me booklet for number of diapers to expect by day of life and EBM storage guidelines.   Maternal Data    Feeding Feeding Type: Breast Fed Length of feed: 25 min  LATCH Score/Interventions Latch: Repeated attempts needed to sustain latch, nipple held in mouth throughout feeding, stimulation needed to elicit sucking reflex. Intervention(s): Waking techniques Intervention(s): Adjust position;Assist with latch;Breast massage;Breast compression  Audible Swallowing: A few with stimulation  Type of Nipple: Flat  Comfort (Breast/Nipple): Soft / non-tender     Hold (Positioning): Assistance  needed to correctly position infant at breast and maintain latch.  LATCH Score: 6  Lactation Tools Discussed/Used     Consult Status Consult Status: PRN    Geralynn Ochs 09/19/2015, 10:25 AM

## 2015-09-20 ENCOUNTER — Ambulatory Visit: Payer: Self-pay

## 2015-09-20 NOTE — Lactation Note (Signed)
This note was copied from a baby's chart. Lactation Consultation Note  Patient Name: Leah Holt Today's Date: 09/20/2015 Reason for consult: Follow-up assessment;Other (Comment) (5% weight loss, Bili check at 54 hours 5.9 )  Baby already latched when consult started. Baby feeding in a consistent swallowing pattern, with multiply  swallows, increased with breast compressions.  Breast warm to touch , and cooled down has baby softened the breast.  Sore nipple and engorgement prevention reviewed with mom .  LC encouraged breast feeding on demand and if the baby isn't showing feeding cues by 2 1/2 hours to place the baby skin to skin ,  Offer breast by 3 hours.  Mom already has a hand pump.  Mother informed of post-discharge support and given phone number to the lactation department, including services for phone call assistance; out-patient appointments; and breastfeeding support group. List of other breastfeeding resources in the community given in the handout. Encouraged mother to call for problems or concerns related to breastfeeding.   Maternal Data Has patient been taught Hand Expression?: Yes  Feeding Feeding Type:  (baby latched , multiply swallows noted ) Length of feed: 38 min (latch time per mom, LC observed , multiply swallows )  LATCH Score/Interventions Latch:  (LATCHED  with depth )  Audible Swallowing:  (multiply swallows )  Type of Nipple:  (nipplr well rounded when baby  released )  Comfort (Breast/Nipple):  (per mom comfortable )     Hold (Positioning):  (mom independent with latch ) Intervention(s): Breastfeeding basics reviewed;Support Pillows;Skin to skin (enc skin to skin with every feeding until baby can stay awake for feeding )     Lactation Tools Discussed/Used Tools: Pump (mom already has a hand pump ) Breast pump type: Manual   Consult Status Consult Status: Complete Date: 09/20/15    Kathrin Greathouse 09/20/2015, 9:34 AM

## 2016-08-07 DIAGNOSIS — F4310 Post-traumatic stress disorder, unspecified: Secondary | ICD-10-CM

## 2016-08-07 DIAGNOSIS — F3181 Bipolar II disorder: Secondary | ICD-10-CM

## 2016-08-07 HISTORY — DX: Bipolar II disorder: F31.81

## 2016-08-07 HISTORY — DX: Post-traumatic stress disorder, unspecified: F43.10

## 2016-10-16 IMAGING — US US OB TRANSVAGINAL
1 series · 14 of 28 positions shown · non-contrast
Comparison: None.

CLINICAL DATA: 18-year-old pregnant female with pelvic pain.



[Series 1: us ob transvaginal · 0.17mm/px · 50 acquisitions, 14 frames shown]
[im 2/50]
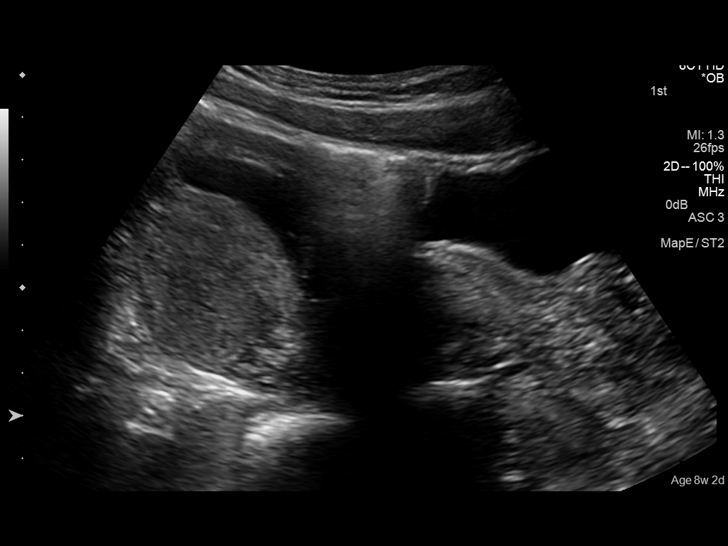
[im 6/50]
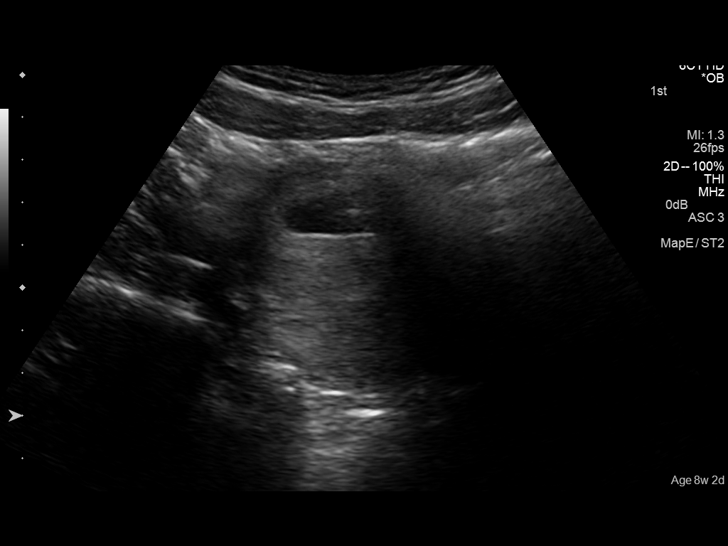
[im 10/50]
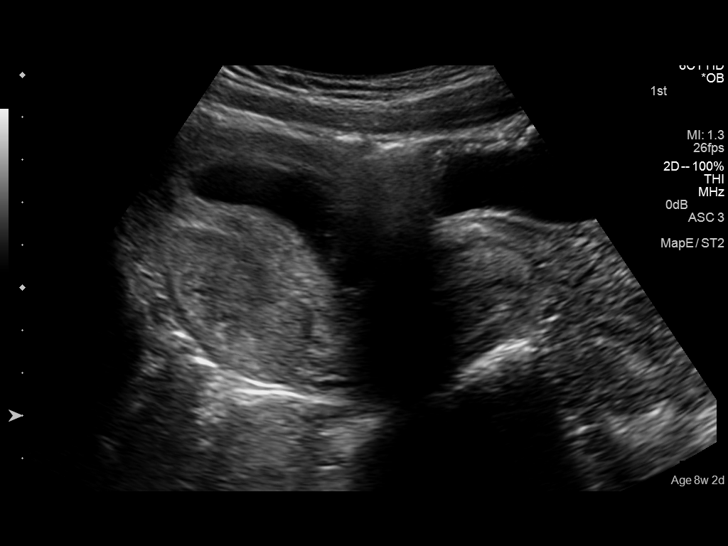
[im 13/50]
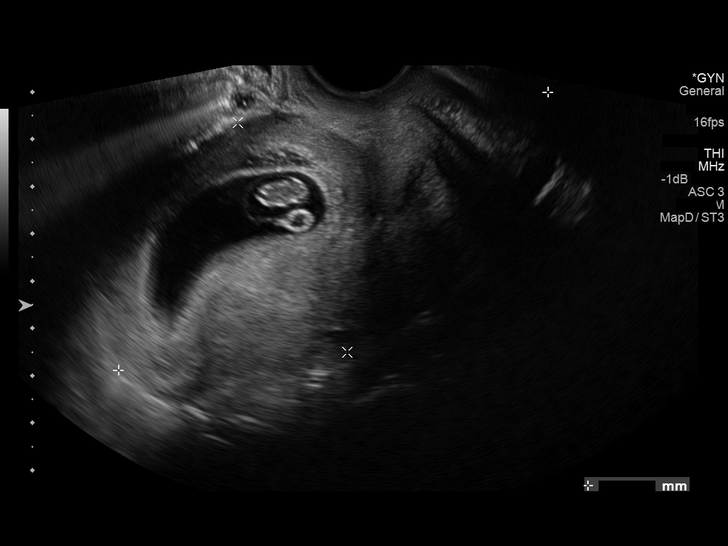
[im 17/50]
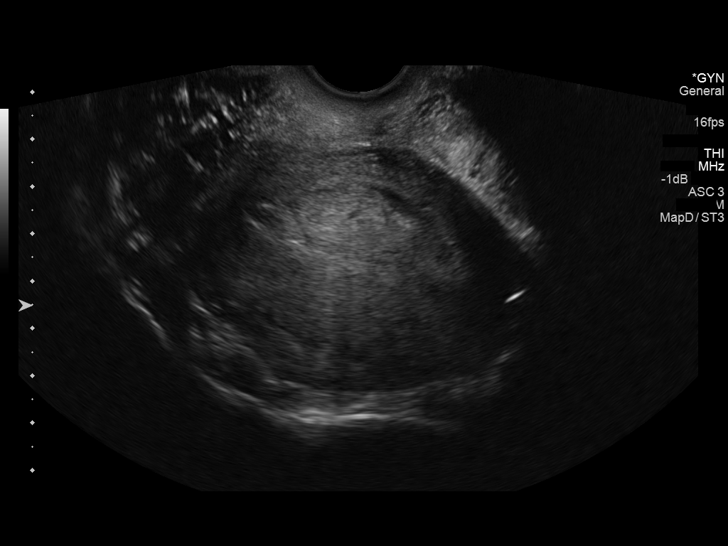
[im 20/50]
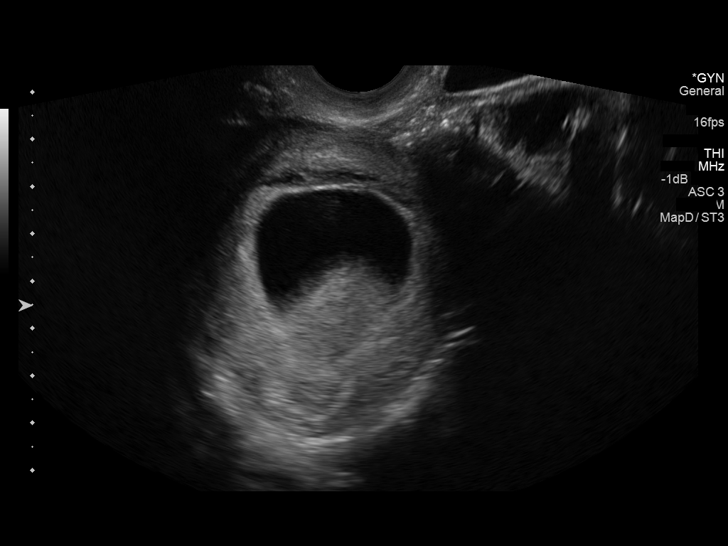
[im 24/50]
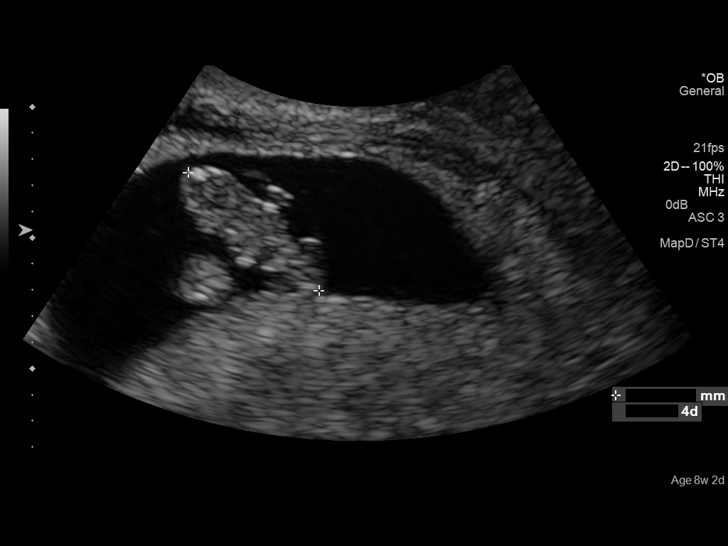
[im 28/50]
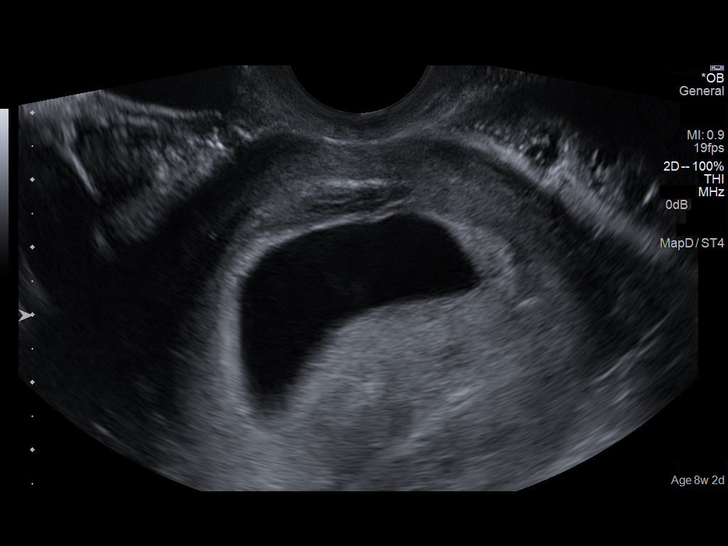
[im 31/50]
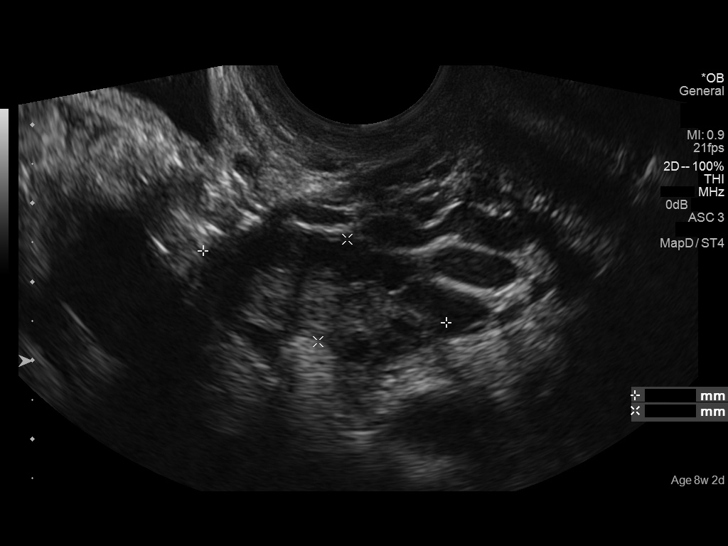
[im 35/50]
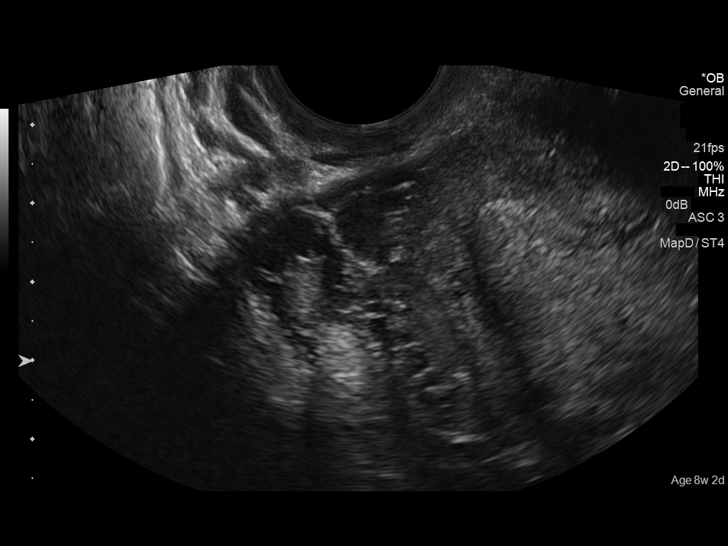
[im 39/50]
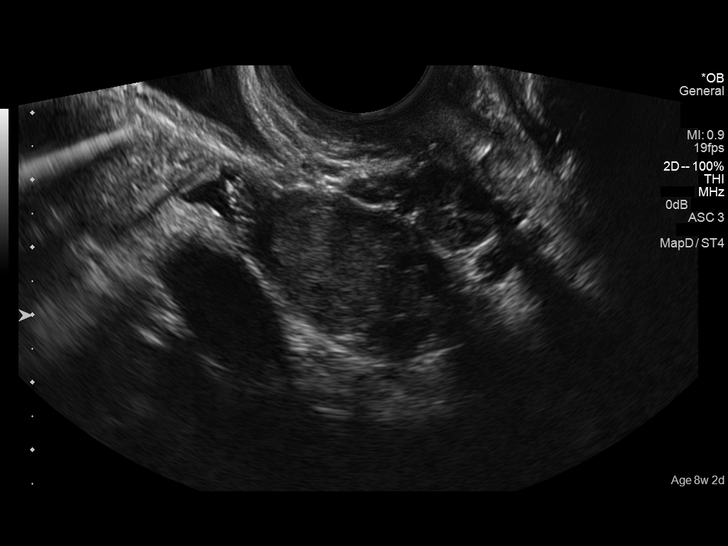
[im 42/50]
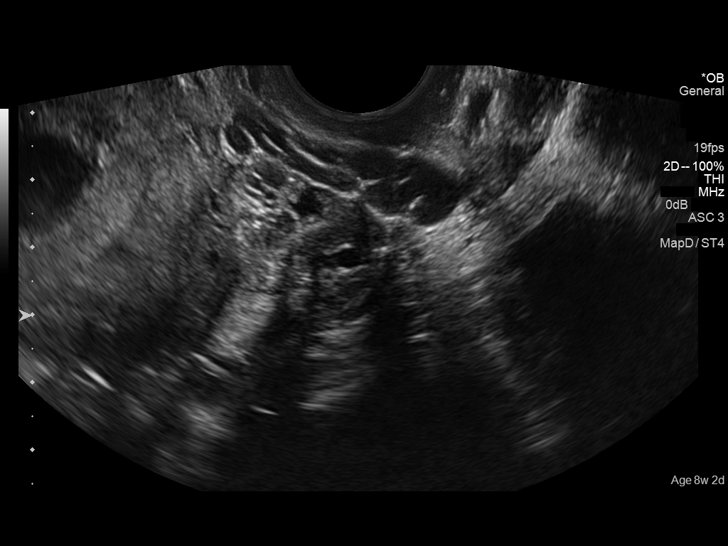
[im 46/50]
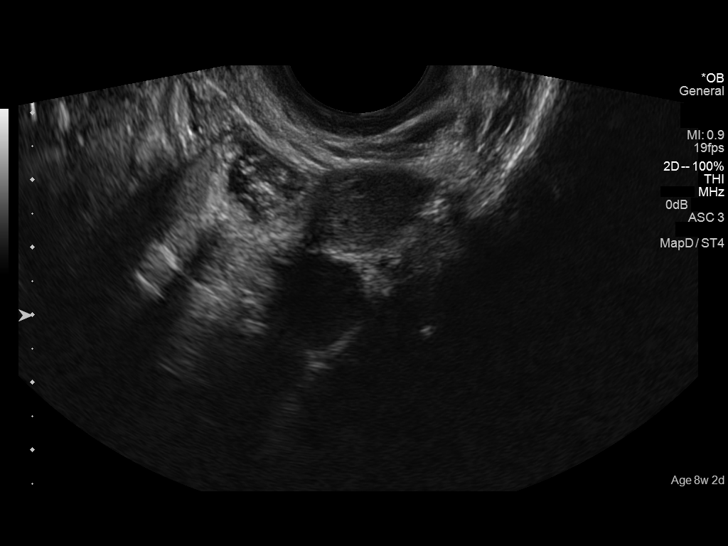
[im 50/50]
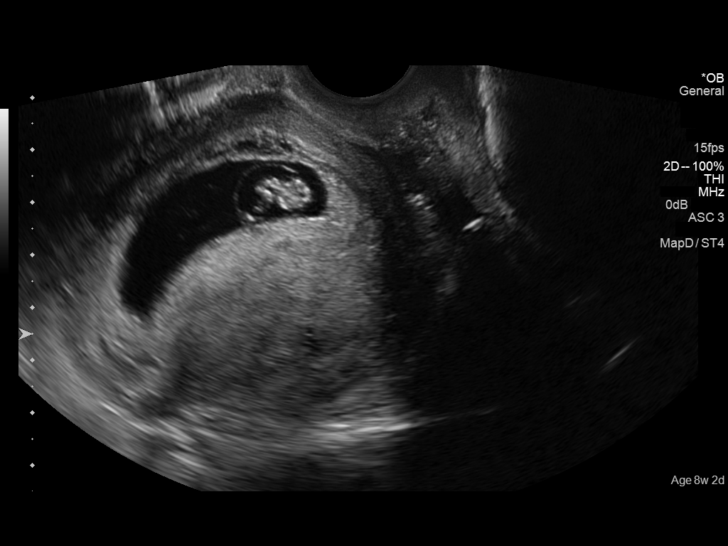

[14 of 28 positions shown; findings below may reference images not displayed]

FINDINGS: Intrauterine gestational sac: Visualized/normal in shape.

Yolk sac:  Seen

Embryo:  Present

Cardiac Activity: Present

Heart Rate: 153 bpm

CRL:   13  mm   7 w 4 d                  US EDC: 09/18/2015

Maternal uterus/adnexae: The uterus measures 10.8 x 5.4 x 6.9 cm
appears grossly unremarkable. The right ovary measures 3.2 x 1.4 x
2.0 cm and the left ovary measures 3.0 x 1.9 x 2.7 cm.

Doppler images demonstrate flow to both ovaries.
IMPRESSION: Single live intrauterine pregnancy with estimated gestational age of
7 weeks, 4 days.

## 2022-11-06 DIAGNOSIS — M1990 Unspecified osteoarthritis, unspecified site: Secondary | ICD-10-CM

## 2022-11-06 HISTORY — DX: Unspecified osteoarthritis, unspecified site: M19.90
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
## 2024-01-25 ENCOUNTER — Ambulatory Visit

## 2024-01-25 ENCOUNTER — Encounter: Payer: Self-pay | Admitting: Rheumatology

## 2024-01-25 ENCOUNTER — Ambulatory Visit: Payer: Self-pay | Attending: Rheumatology | Admitting: Rheumatology

## 2024-01-25 VITALS — BP 110/72 | HR 60 | Resp 16 | Ht 65.0 in | Wt 154.2 lb

## 2024-01-25 DIAGNOSIS — M25561 Pain in right knee: Secondary | ICD-10-CM | POA: Diagnosis present

## 2024-01-25 DIAGNOSIS — M545 Low back pain, unspecified: Secondary | ICD-10-CM | POA: Diagnosis not present

## 2024-01-25 DIAGNOSIS — M4156 Other secondary scoliosis, lumbar region: Secondary | ICD-10-CM

## 2024-01-25 DIAGNOSIS — M79671 Pain in right foot: Secondary | ICD-10-CM | POA: Diagnosis not present

## 2024-01-25 DIAGNOSIS — M79641 Pain in right hand: Secondary | ICD-10-CM | POA: Diagnosis present

## 2024-01-25 DIAGNOSIS — F3132 Bipolar disorder, current episode depressed, moderate: Secondary | ICD-10-CM | POA: Diagnosis present

## 2024-01-25 DIAGNOSIS — M25562 Pain in left knee: Secondary | ICD-10-CM | POA: Diagnosis not present

## 2024-01-25 DIAGNOSIS — L308 Other specified dermatitis: Secondary | ICD-10-CM | POA: Diagnosis present

## 2024-01-25 DIAGNOSIS — M79642 Pain in left hand: Secondary | ICD-10-CM

## 2024-01-25 DIAGNOSIS — M79672 Pain in left foot: Secondary | ICD-10-CM | POA: Insufficient documentation

## 2024-01-25 DIAGNOSIS — R768 Other specified abnormal immunological findings in serum: Secondary | ICD-10-CM | POA: Insufficient documentation

## 2024-01-25 DIAGNOSIS — R748 Abnormal levels of other serum enzymes: Secondary | ICD-10-CM

## 2024-01-25 DIAGNOSIS — G8929 Other chronic pain: Secondary | ICD-10-CM | POA: Diagnosis not present

## 2024-01-25 DIAGNOSIS — M546 Pain in thoracic spine: Secondary | ICD-10-CM | POA: Insufficient documentation

## 2024-01-25 DIAGNOSIS — Z8709 Personal history of other diseases of the respiratory system: Secondary | ICD-10-CM | POA: Diagnosis present

## 2024-01-25 NOTE — Patient Instructions (Signed)
 Exercises for Chronic Knee Pain Chronic knee pain is pain that lasts longer than 3 months. For most people with chronic knee pain, exercise and weight loss is an important part of treatment. Your health care provider may want you to focus on: Making the muscles that support your knee stronger. This can take pressure off your knee and reduce pain. Preventing knee stiffness. How far you can move your knee, keeping it there or making it farther. Losing weight (if this applies) to take pressure off your knee, lower your risk for injury, and make it easier for you to exercise. Your provider will help you make an exercise program that fits your needs and physical abilities. Below are simple, low-impact exercises you can do at home. Ask your provider or physical therapist how often you should do your exercise program and how many times to repeat each exercise. General safety tips  Get your provider's approval before doing any exercises. Start slowly and stop any time you feel pain. Do not exercise if your knee pain is flaring up. Warm up first. Stretching a cold muscle can cause an injury. Do 5-10 minutes of easy movement or light stretching before beginning your exercises. Do 5-10 minutes of low-impact activity (like walking or cycling) before starting strengthening exercises. Contact your provider any time you have pain during or after exercising. Exercise can cause discomfort but should not be painful. It is normal to be a little stiff or sore after exercising. Stretching and range-of-motion exercises Front thigh stretch  Stand up straight and support your body by holding on to a chair or resting one hand on a wall. With your legs straight and close together, bend one knee to lift your heel up toward your butt. Using one hand for support, grab your ankle with your free hand. Pull your foot up closer toward your butt to feel the stretch in front of your thigh. Hold the stretch for 30  seconds. Repeat __________ times. Complete this exercise __________ times a day. Back thigh stretch  Sit on the floor with your back straight and your legs out straight in front of you. Place the palms of your hands on the floor and slide them toward your feet as you bend at the hip. Try to touch your nose to your knees and feel the stretch in the back of your thighs. Hold for 30 seconds. Repeat __________ times. Complete this exercise __________ times a day. Calf stretch  Stand facing a wall. Place the palms of your hands flat against the wall, arms extended, and lean slightly against the wall. Get into a lunge position with one leg bent at the knee and the other leg stretched out straight behind you. Keep both feet facing the wall and increase the bend in your knee while keeping the heel of the other leg flat on the ground. You should feel the stretch in your calf. Hold for 30 seconds. Repeat __________ times. Complete this exercise __________ times a day. Strengthening exercises Straight leg lift  Lie on your back with one knee bent and the other leg out straight. Slowly lift the straight leg without bending the knee. Lift until your foot is about 12 inches (30 cm) off the floor. Hold for 3-5 seconds and slowly lower your leg. Repeat __________ times. Complete this exercise __________ times a day. Single leg dip  Stand between two chairs and put both hands on the backs of the chairs for support. Extend one leg out straight with your body  weight resting on the heel of the standing leg. Slowly bend your standing knee to dip your body to the level that is comfortable for you. Hold for 3-5 seconds. Repeat __________ times. Complete this exercise __________ times a day. Hamstring curls  Stand straight, knees close together, facing the back of a chair. Hold on to the back of a chair with both hands. Keep one leg straight. Bend the other knee while bringing the heel up toward the butt  until the knee is bent at a 90-degree angle (right angle). Hold for 3-5 seconds. Repeat __________ times. Complete this exercise __________ times a day. Wall squat  Stand straight with your back, hips, and head against a wall. Step forward one foot at a time with your back still against the wall. Your feet should be 2 feet (61 cm) from the wall at shoulder width. Keeping your back, hips, and head against the wall, slide down the wall to as close to a sitting position as you can get. Hold for 5-10 seconds, then slowly slide back up. Repeat __________ times. Complete this exercise __________ times a day. Step-ups  Stand in front of a sturdy platform or stool that is about 6 inches (15 cm) high. Slowly step up with your left / right foot, keeping your knee in line with your hip and foot. Do not let your knee bend so far that you cannot see your toes. Hold on to a chair for balance, but do not use it for support. Slowly unlock your knee and lower yourself to the starting position. Repeat __________ times. Complete this exercise __________ times a day. Contact a health care provider if: Your exercises cause pain. Your pain is worse after you exercise. Your pain prevents you from doing your exercises. This information is not intended to replace advice given to you by your health care provider. Make sure you discuss any questions you have with your health care provider. Document Revised: 08/08/2022 Document Reviewed: 08/08/2022 [employer] Patient Education  2024 [employer]  Hand Exercises Hand exercises can be helpful for almost anyone. They can strengthen your hands and improve flexibility and movement. The exercises can also increase blood flow to the hands. These results can make your work and daily tasks easier for you. Hand exercises can be especially helpful for people who have joint pain from arthritis or nerve damage from using their hands over and over. These exercises can also help  people who injure a hand. Exercises Most of these hand exercises are gentle stretching and motion exercises. It is usually safe to do them often throughout the day. Warming up your hands before exercise may help reduce stiffness. You can do this with gentle massage or by placing your hands in warm water for 10-15 minutes. It is normal to feel some stretching, pulling, tightness, or mild discomfort when you begin new exercises. In time, this will improve. Remember to always be careful and stop right away if you feel sudden, very bad pain or your pain gets worse. You want to get better and be safe. Ask your health care provider which exercises are safe for you. Do exercises exactly as told by your provider and adjust them as told. Do not begin these exercises until told by your provider. Knuckle bend or claw fist  Stand or sit with your arm, hand, and all five fingers pointed straight up. Make sure to keep your wrist straight. Gently bend your fingers down toward your palm until the tips of your  fingers are touching your palm. Keep your big knuckle straight and only bend the small knuckles in your fingers. Hold this position for 10 seconds. Straighten your fingers back to your starting position. Repeat this exercise 5-10 times with each hand. Full finger fist  Stand or sit with your arm, hand, and all five fingers pointed straight up. Make sure to keep your wrist straight. Gently bend your fingers into your palm until the tips of your fingers are touching the middle of your palm. Hold this position for 10 seconds. Extend your fingers back to your starting position, stretching every joint fully. Repeat this exercise 5-10 times with each hand. Straight fist  Stand or sit with your arm, hand, and all five fingers pointed straight up. Make sure to keep your wrist straight. Gently bend your fingers at the big knuckle, where your fingers meet your hand, and at the middle knuckle. Keep the knuckle at  the tips of your fingers straight and try to touch the bottom of your palm. Hold this position for 10 seconds. Extend your fingers back to your starting position, stretching every joint fully. Repeat this exercise 5-10 times with each hand. Tabletop  Stand or sit with your arm, hand, and all five fingers pointed straight up. Make sure to keep your wrist straight. Gently bend your fingers at the big knuckle, where your fingers meet your hand, as far down as you can. Keep the small knuckles in your fingers straight. Think of forming a tabletop with your fingers. Hold this position for 10 seconds. Extend your fingers back to your starting position, stretching every joint fully. Repeat this exercise 5-10 times with each hand. Finger spread  Place your hand flat on a table with your palm facing down. Make sure your wrist stays straight. Spread your fingers and thumb apart from each other as far as you can until you feel a gentle stretch. Hold this position for 10 seconds. Bring your fingers and thumb tight together again. Hold this position for 10 seconds. Repeat this exercise 5-10 times with each hand. Making circles  Stand or sit with your arm, hand, and all five fingers pointed straight up. Make sure to keep your wrist straight. Make a circle by touching the tip of your thumb to the tip of your index finger. Hold for 10 seconds. Then open your hand wide. Repeat this motion with your thumb and each of your fingers. Repeat this exercise 5-10 times with each hand. Thumb motion  Sit with your forearm resting on a table and your wrist straight. Your thumb should be facing up toward the ceiling. Keep your fingers relaxed as you move your thumb. Lift your thumb up as high as you can toward the ceiling. Hold for 10 seconds. Bend your thumb across your palm as far as you can, reaching the tip of your thumb for the small finger (pinkie) side of your palm. Hold for 10 seconds. Repeat this exercise  5-10 times with each hand. Grip strengthening  Hold a stress ball or other soft ball in the middle of your hand. Slowly increase the pressure, squeezing the ball as much as you can without causing pain. Think of bringing the tips of your fingers into the middle of your palm. All of your finger joints should bend when doing this exercise. Hold your squeeze for 10 seconds, then relax. Repeat this exercise 5-10 times with each hand. Contact a health care provider if: Your hand pain or discomfort gets much worse when  you do an exercise. Your hand pain or discomfort does not improve within 2 hours after you exercise. If you have either of these problems, stop doing these exercises right away. Do not do them again unless your provider says that you can. Get help right away if: You develop sudden, severe hand pain or swelling. If this happens, stop doing these exercises right away. Do not do them again unless your provider says that you can. This information is not intended to replace advice given to you by your health care provider. Make sure you discuss any questions you have with your health care provider. Document Revised: 08/08/2022 Document Reviewed: 08/08/2022 [employer] Patient Education  2024 [employer]  Thoracic Strain Rehab Ask your health care provider which exercises are safe for you. Do exercises exactly as told by your provider and adjust them as directed. It is normal to feel mild stretching, pulling, tightness, or discomfort as you do these exercises. Stop right away if you feel sudden pain or your pain gets worse. Do not begin these exercises until told by your provider. Stretching and range-of-motion exercise This exercise warms up your muscles and joints and improves the movement and flexibility of your back and shoulders. This exercise also helps to relieve pain. Chest and spine stretch  Lie down on your back on a firm surface. Roll a towel or a small blanket so it is about 4  inches (10 cm) in diameter. Put the towel under the middle of your back so it is under your spine, but not under your shoulder blades. Put your hands behind your head and let your elbows fall to your sides. This will increase your stretch. Take a deep breath (inhale). Hold for __________ seconds. Relax after you breathe out (exhale). Repeat __________ times. Complete this exercise __________ times a day. Strengthening exercises These exercises build strength and endurance in your back and your shoulder blade muscles. Endurance is the ability to use your muscles for a long time, even after they get tired. Alternating arm and leg raises  Get on your hands and knees on a firm surface. If you are on a hard floor, you may want to use padding, such as an exercise mat, to cushion your knees. Line up your arms and legs. Your hands should be directly below your shoulders, and your knees should be directly below your hips. Lift your left leg behind you. At the same time, raise your right arm and straighten it in front of you. Do not lift your leg higher than your hip. Do not lift your arm higher than your shoulder. Keep your abdominal and back muscles tight. Keep your hips facing the ground. Do not arch your back. Carefully stay balanced. Do not hold your breath. Hold for __________ seconds. Slowly return to the starting position and repeat with your right leg and your left arm. Repeat __________ times. Complete this exercise __________ times a day. Straight arm rows This exercise is also called the shoulder extension exercise. Stand with your feet shoulder width apart. Secure an exercise band to a stable object in front of you so the band is at or above shoulder height. Hold one end of the exercise band in each hand. Straighten your elbows and lift your hands up to shoulder height. Step back, away from the secured end of the exercise band, until the band stretches. Squeeze your shoulder blades  together and pull your hands down to the sides of your thighs. Stop when your hands are  straight down by your sides. This is shoulder extension. Do not let your hands go behind your body. Hold for __________ seconds. Slowly return to the starting position. Repeat __________ times. Complete this exercise __________ times a day. Rowing scapular retraction This is an exercise in which the shoulder blades (scapulae) are pulled toward each other (retraction). Sit in a stable chair without armrests, or stand up. Secure an exercise band to a stable object in front of you so the band is at shoulder height. Hold one end of the exercise band in each hand. Your palms should face toward each other. Bring your arms out straight in front of you. Step back, away from the secured end of the exercise band, until the band stretches. Pull the band backward. As you do this, bend your elbows and squeeze your shoulder blades together, but avoid letting the rest of your body move. Do not shrug your shoulders upward while you do this. Stop when your elbows are at your sides or slightly behind your body. Hold for __________ seconds. Slowly straighten your arms to return to the starting position. Repeat __________ times. Complete this exercise __________ times a day. Posture and body mechanics Good posture and healthy body mechanics can help to relieve stress in your body's tissues and joints. Body mechanics refers to the movements and positions of your body while you do your daily activities. Posture is part of body mechanics. Good posture means: Your spine is in its natural S-curve position (neutral). Your shoulders are pulled back slightly. Your head is not tipped forward. Follow these guidelines to improve your posture and body mechanics in your everyday activities. Standing  When standing, keep your spine neutral and your feet about hip width apart. Keep a slight bend in your knees. Your ears, shoulders, and hips  should line up with each other. When you do a task in which you lean forward while standing in one place for a long time, place one foot up on a stable object that is 2-4 inches (5-10 cm) high, such as a footstool. This helps keep your spine neutral. Sitting  When sitting, keep your spine neutral and keep your feet flat on the floor. Use a footrest if needed. Keep your thighs parallel to the floor. Avoid rounding your shoulders, and avoid tilting your head forward. When working at a desk or a computer, keep your desk at a height where your hands are slightly lower than your elbows. Slide your chair under your desk so you are close enough to maintain good posture. When working at a computer, place your monitor at a height where you are looking straight ahead and you do not have to tilt your head forward or downward to look at the screen. Resting When lying down and resting, avoid positions that are most painful for you. If you have pain with activities such as sitting, bending, stooping, or squatting (flexion-basedactivities), lie in a position in which your body does not bend very much. For example, avoid curling up on your side with your arms and knees near your chest (fetal position). If you have pain with activities such as standing for a long time or reaching with your arms (extension-basedactivities), lie with your spine in a neutral position and bend your knees slightly. Try the following positions: Lie on your side with a pillow between your knees. Lie on your back with a pillow under your knees.  Lifting  When lifting objects, keep your feet at least shoulder width apart and  tighten your abdominal muscles. Bend your knees and hips and keep your spine neutral. It is important to lift using the strength of your legs, not your back. Do not lock your knees straight out. Always ask for help to lift heavy or awkward objects. This information is not intended to replace advice given to you by your  health care provider. Make sure you discuss any questions you have with your health care provider. Document Revised: 01/05/2023 Document Reviewed: 03/13/2022 [employer] Patient Education  2024 [employer] Low Back Sprain or Strain Rehab Ask your health care provider which exercises are safe for you. Do exercises exactly as told by your health care provider and adjust them as directed. It is normal to feel mild stretching, pulling, tightness, or discomfort as you do these exercises. Stop right away if you feel sudden pain or your pain gets worse. Do not begin these exercises until told by your health care provider. Stretching and range-of-motion exercises These exercises warm up your muscles and joints and improve the movement and flexibility of your back. These exercises also help to relieve pain, numbness, and tingling. Lumbar rotation  Lie on your back on a firm bed or the floor with your knees bent. Straighten your arms out to your sides so each arm forms a 90-degree angle (right angle) with a side of your body. Slowly move (rotate) both of your knees to one side of your body until you feel a stretch in your lower back (lumbar). Try not to let your shoulders lift off the floor. Hold this position for __________ seconds. Tense your abdominal muscles and slowly move your knees back to the starting position. Repeat this exercise on the other side of your body. Repeat __________ times. Complete this exercise __________ times a day. Single knee to chest  Lie on your back on a firm bed or the floor with both legs straight. Bend one of your knees. Use your hands to move your knee up toward your chest until you feel a gentle stretch in your lower back and buttock. Hold your leg in this position by holding on to the front of your knee. Keep your other leg as straight as possible. Hold this position for __________ seconds. Slowly return to the starting position. Repeat with your other leg. Repeat  __________ times. Complete this exercise __________ times a day. Prone extension on elbows  Lie on your abdomen on a firm bed or the floor (prone position). Prop yourself up on your elbows. Use your arms to help lift your chest up until you feel a gentle stretch in your abdomen and your lower back. This will place some of your body weight on your elbows. If this is uncomfortable, try stacking pillows under your chest. Your hips should stay down, against the surface that you are lying on. Keep your hip and back muscles relaxed. Hold this position for __________ seconds. Slowly relax your upper body and return to the starting position. Repeat __________ times. Complete this exercise __________ times a day. Strengthening exercises These exercises build strength and endurance in your back. Endurance is the ability to use your muscles for a long time, even after they get tired. Pelvic tilt This exercise strengthens the muscles that lie deep in the abdomen. Lie on your back on a firm bed or the floor with your legs extended. Bend your knees so they are pointing toward the ceiling and your feet are flat on the floor. Tighten your lower abdominal muscles to press your  lower back against the floor. This motion will tilt your pelvis so your tailbone points up toward the ceiling instead of pointing to your feet or the floor. To help with this exercise, you may place a small towel under your lower back and try to push your back into the towel. Hold this position for __________ seconds. Let your muscles relax completely before you repeat this exercise. Repeat __________ times. Complete this exercise __________ times a day. Alternating arm and leg raises  Get on your hands and knees on a firm surface. If you are on a hard floor, you may want to use padding, such as an exercise mat, to cushion your knees. Line up your arms and legs. Your hands should be directly below your shoulders, and your knees should  be directly below your hips. Lift your left leg behind you. At the same time, raise your right arm and straighten it in front of you. Do not lift your leg higher than your hip. Do not lift your arm higher than your shoulder. Keep your abdominal and back muscles tight. Keep your hips facing the ground. Do not arch your back. Keep your balance carefully, and do not hold your breath. Hold this position for __________ seconds. Slowly return to the starting position. Repeat with your right leg and your left arm. Repeat __________ times. Complete this exercise __________ times a day. Abdominal set with straight leg raise  Lie on your back on a firm bed or the floor. Bend one of your knees and keep your other leg straight. Tense your abdominal muscles and lift your straight leg up, 4-6 inches (10-15 cm) off the ground. Keep your abdominal muscles tight and hold this position for __________ seconds. Do not hold your breath. Do not arch your back. Keep it flat against the ground. Keep your abdominal muscles tense as you slowly lower your leg back to the starting position. Repeat with your other leg. Repeat __________ times. Complete this exercise __________ times a day. Single leg lower with bent knees Lie on your back on a firm bed or the floor. Tense your abdominal muscles and lift your feet off the floor, one foot at a time, so your knees and hips are bent in 90-degree angles (right angles). Your knees should be over your hips and your lower legs should be parallel to the floor. Keeping your abdominal muscles tense and your knee bent, slowly lower one of your legs so your toe touches the ground. Lift your leg back up to return to the starting position. Do not hold your breath. Do not let your back arch. Keep your back flat against the ground. Repeat with your other leg. Repeat __________ times. Complete this exercise __________ times a day. Posture and body mechanics Good posture and  healthy body mechanics can help to relieve stress in your body's tissues and joints. Body mechanics refers to the movements and positions of your body while you do your daily activities. Posture is part of body mechanics. Good posture means: Your spine is in its natural S-curve position (neutral). Your shoulders are pulled back slightly. Your head is not tipped forward (neutral). Follow these guidelines to improve your posture and body mechanics in your everyday activities. Standing  When standing, keep your spine neutral and your feet about hip-width apart. Keep a slight bend in your knees. Your ears, shoulders, and hips should line up. When you do a task in which you stand in one place for a long time, place  one foot up on a stable object that is 2-4 inches (5-10 cm) high, such as a footstool. This helps keep your spine neutral. Sitting  When sitting, keep your spine neutral and keep your feet flat on the floor. Use a footrest, if necessary, and keep your thighs parallel to the floor. Avoid rounding your shoulders, and avoid tilting your head forward. When working at a desk or a computer, keep your desk at a height where your hands are slightly lower than your elbows. Slide your chair under your desk so you are close enough to maintain good posture. When working at a computer, place your monitor at a height where you are looking straight ahead and you do not have to tilt your head forward or downward to look at the screen. Resting When lying down and resting, avoid positions that are most painful for you. If you have pain with activities such as sitting, bending, stooping, or squatting, lie in a position in which your body does not bend very much. For example, avoid curling up on your side with your arms and knees near your chest (fetal position). If you have pain with activities such as standing for a long time or reaching with your arms, lie with your spine in a neutral position and bend your  knees slightly. Try the following positions: Lying on your side with a pillow between your knees. Lying on your back with a pillow under your knees. Lifting  When lifting objects, keep your feet at least shoulder-width apart and tighten your abdominal muscles. Bend your knees and hips and keep your spine neutral. It is important to lift using the strength of your legs, not your back. Do not lock your knees straight out. Always ask for help to lift heavy or awkward objects. This information is not intended to replace advice given to you by your health care provider. Make sure you discuss any questions you have with your health care provider. Document Revised: 11/27/2022 Document Reviewed: 10/11/2020 Elsevier Patient Education  2024 [employer].

## 2024-01-26 LAB — HEPATITIS B CORE ANTIBODY, IGM: Hep B C IgM: NONREACTIVE

## 2024-01-26 LAB — RHEUMATOID FACTOR: Rheumatoid fact SerPl-aCnc: 319 [IU]/mL — ABNORMAL HIGH (ref <14)

## 2024-01-26 LAB — HEPATITIS B SURFACE ANTIGEN: Hepatitis B Surface Ag: NONREACTIVE

## 2024-01-26 LAB — ANA: Anti Nuclear Antibody (ANA): NEGATIVE

## 2024-01-26 LAB — HEPATITIS C ANTIBODY: Hepatitis C Ab: NONREACTIVE

## 2024-01-26 LAB — SEDIMENTATION RATE: Sed Rate: 6 mm/h (ref 0–20)

## 2024-01-27 ENCOUNTER — Ambulatory Visit: Payer: Self-pay | Admitting: Physician Assistant

## 2024-03-11 ENCOUNTER — Encounter: Payer: Self-pay | Admitting: Rheumatology

## 2024-03-21 NOTE — Progress Notes (Deleted)
 Office Visit Note  Patient: Leah Holt             Date of Birth: [birth year]           MRN: [id number]             PCP: Ozell Peter, PA-C Referring: No ref. provider found Visit Date: 04/04/2024 Occupation: @GUAROCC @  Subjective:  No chief complaint on file.   History of Present Illness: Leah Holt is a 28 y.o. female ***     Activities of Daily Living:  Patient reports morning stiffness for *** {minute/hour:19697}.   Patient {ACTIONS;DENIES/REPORTS:21021675::Denies} nocturnal pain.  Difficulty dressing/grooming: {ACTIONS;DENIES/REPORTS:21021675::Denies} Difficulty climbing stairs: {ACTIONS;DENIES/REPORTS:21021675::Denies} Difficulty getting out of chair: {ACTIONS;DENIES/REPORTS:21021675::Denies} Difficulty using hands for taps, buttons, cutlery, and/or writing: {ACTIONS;DENIES/REPORTS:21021675::Denies}  No Rheumatology ROS completed.   PMFS History:  Patient Active Problem List   Diagnosis Date Noted   Normal labor and delivery 09/17/2015   NVD (normal vaginal delivery) 09/17/2015    Past Medical History:  Diagnosis Date   Anemia    Anxiety    Arthritis 11/2022   Bipolar 2 disorder (HCC) 08/2016   PTSD (post-traumatic stress disorder) 2018    Family History  Problem Relation Age of Onset   Diabetes Mother    Cancer Maternal Grandmother    Hypertension Maternal Grandmother    Early death Maternal Grandfather    Drug abuse Maternal Grandfather    Miscarriages / Stillbirths Maternal Aunt    Diabetes Maternal Uncle    Hypertension Maternal Uncle    Past Surgical History:  Procedure Laterality Date   DENTAL SURGERY N/A 2016   dental implants   Social History   Social History Narrative   Not on file   Immunization History  Administered Date(s) Administered   Tdap 04/23/2012     Objective: Vital Signs: There were no vitals taken for this visit.   Physical Exam   Musculoskeletal Exam: ***  CDAI Exam: CDAI Score:  -- Patient Global: --; Provider Global: -- Swollen: --; Tender: -- Joint Exam 04/04/2024   No joint exam has been documented for this visit   There is currently no information documented on the homunculus. Go to the Rheumatology activity and complete the homunculus joint exam.  Investigation: No additional findings.  Imaging: No results found.  Recent Labs: Lab Results  Component Value Date   WBC 11.9 (H) 09/18/2015   HGB 11.1 (L) 09/18/2015   PLT 179 09/18/2015   NA 134 (L) 02/03/2015   K 3.9 02/03/2015   CL 102 02/03/2015   CO2 21 (L) 02/03/2015   GLUCOSE 103 (H) 02/03/2015   BUN <5 (L) 02/03/2015   CREATININE 0.59 02/03/2015   BILITOT 0.7 02/03/2015   ALKPHOS 22 (L) 02/03/2015   AST 22 02/03/2015   ALT 17 02/03/2015   PROT 7.2 02/03/2015   ALBUMIN 4.1 02/03/2015   CALCIUM 9.4 02/03/2015   GFRAA >60 02/03/2015   January 25, 2024 RF 319, sed rate 6, ANA negative, hepatitis B and C nonreactive  Speciality Comments: No specialty comments available.  Procedures:  No procedures performed Allergies: Citrus, Other, and Latex   Assessment / Plan:     Visit Diagnoses: No diagnosis found.  Orders: No orders of the defined types were placed in this encounter.  No orders of the defined types were placed in this encounter.   Face-to-face time spent with patient was *** minutes. Greater than 50% of time was spent in counseling and coordination of care.  Follow-Up Instructions: No follow-ups  on file.   Maya Nash, MD  Note - This record has been created using Animal nutritionist.  Chart creation errors have been sought, but may not always  have been located. Such creation errors do not reflect on  the standard of medical care.

## 2024-03-28 ENCOUNTER — Other Ambulatory Visit: Admitting: Rheumatology

## 2024-04-04 ENCOUNTER — Ambulatory Visit: Payer: Self-pay | Admitting: Rheumatology

## 2024-04-04 DIAGNOSIS — F3132 Bipolar disorder, current episode depressed, moderate: Secondary | ICD-10-CM

## 2024-04-04 DIAGNOSIS — M79671 Pain in right foot: Secondary | ICD-10-CM

## 2024-04-04 DIAGNOSIS — M0579 Rheumatoid arthritis with rheumatoid factor of multiple sites without organ or systems involvement: Secondary | ICD-10-CM

## 2024-04-04 DIAGNOSIS — M546 Pain in thoracic spine: Secondary | ICD-10-CM

## 2024-04-04 DIAGNOSIS — G8929 Other chronic pain: Secondary | ICD-10-CM

## 2024-04-04 DIAGNOSIS — L308 Other specified dermatitis: Secondary | ICD-10-CM

## 2024-04-04 DIAGNOSIS — Z79899 Other long term (current) drug therapy: Secondary | ICD-10-CM

## 2024-04-04 DIAGNOSIS — Z8709 Personal history of other diseases of the respiratory system: Secondary | ICD-10-CM

## 2024-04-04 DIAGNOSIS — M545 Low back pain, unspecified: Secondary | ICD-10-CM

## 2024-04-04 DIAGNOSIS — M79641 Pain in right hand: Secondary | ICD-10-CM
